# Patient Record
Sex: Female | Born: 1990 | Race: White | Hispanic: No | Marital: Single | Smoking: Never smoker
Health system: Southern US, Community
[De-identification: ages and names within clinical notes are randomized; demographics above are authoritative.]

## PROBLEM LIST (undated history)

## (undated) DIAGNOSIS — G43909 Migraine, unspecified, not intractable, without status migrainosus: Secondary | ICD-10-CM

## (undated) DIAGNOSIS — R41840 Attention and concentration deficit: Secondary | ICD-10-CM

## (undated) DIAGNOSIS — Z87440 Personal history of urinary (tract) infections: Secondary | ICD-10-CM

## (undated) DIAGNOSIS — F329 Major depressive disorder, single episode, unspecified: Secondary | ICD-10-CM

## (undated) DIAGNOSIS — F32A Depression, unspecified: Secondary | ICD-10-CM

## (undated) DIAGNOSIS — J45909 Unspecified asthma, uncomplicated: Secondary | ICD-10-CM

## (undated) HISTORY — DX: Unspecified asthma, uncomplicated: J45.909

## (undated) HISTORY — DX: Personal history of urinary (tract) infections: Z87.440

## (undated) HISTORY — DX: Attention and concentration deficit: R41.840

## (undated) HISTORY — DX: Major depressive disorder, single episode, unspecified: F32.9

## (undated) HISTORY — DX: Migraine, unspecified, not intractable, without status migrainosus: G43.909

## (undated) HISTORY — DX: Depression, unspecified: F32.A

---
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
## 2012-02-14 ENCOUNTER — Emergency Department: Payer: Self-pay | Admitting: Emergency Medicine

## 2012-02-14 LAB — URINALYSIS, COMPLETE
Glucose,UR: NEGATIVE mg/dL (ref 0–75)
Nitrite: NEGATIVE
Ph: 6 (ref 4.5–8.0)
Protein: 30
Transitional Epi: <1
WBC UR: 242 /HPF (ref 0–5)

## 2012-02-14 LAB — PREGNANCY, URINE: Pregnancy Test, Urine: NEGATIVE m[IU]/mL

## 2016-06-13 ENCOUNTER — Ambulatory Visit: Payer: Self-pay | Admitting: Primary Care

## 2016-06-27 ENCOUNTER — Ambulatory Visit (INDEPENDENT_AMBULATORY_CARE_PROVIDER_SITE_OTHER): Payer: PRIVATE HEALTH INSURANCE | Admitting: Primary Care

## 2016-06-27 ENCOUNTER — Encounter: Payer: Self-pay | Admitting: Primary Care

## 2016-06-27 VITALS — BP 104/64 | HR 75 | Temp 98.5°F | Ht 66.0 in | Wt 123.4 lb

## 2016-06-27 DIAGNOSIS — R41840 Attention and concentration deficit: Secondary | ICD-10-CM | POA: Diagnosis not present

## 2016-06-27 DIAGNOSIS — J4520 Mild intermittent asthma, uncomplicated: Secondary | ICD-10-CM | POA: Diagnosis not present

## 2016-06-27 DIAGNOSIS — J45909 Unspecified asthma, uncomplicated: Secondary | ICD-10-CM | POA: Insufficient documentation

## 2016-06-27 DIAGNOSIS — F329 Major depressive disorder, single episode, unspecified: Secondary | ICD-10-CM

## 2016-06-27 DIAGNOSIS — G43901 Migraine, unspecified, not intractable, with status migrainosus: Secondary | ICD-10-CM | POA: Diagnosis not present

## 2016-06-27 DIAGNOSIS — F32A Depression, unspecified: Secondary | ICD-10-CM

## 2016-06-27 DIAGNOSIS — G43909 Migraine, unspecified, not intractable, without status migrainosus: Secondary | ICD-10-CM | POA: Insufficient documentation

## 2016-06-27 NOTE — Assessment & Plan Note (Signed)
Dates back to middle school, never on any medications. Symptoms progressing for the past year, now grades suffering. Will set her up for formal testing through psychology. Once results received, will consider treatment.

## 2016-06-27 NOTE — Progress Notes (Signed)
Pre visit review using our clinic review tool, if applicable. No additional management support is needed unless otherwise documented below in the visit note. 

## 2016-06-27 NOTE — Progress Notes (Signed)
Subjective:    Patient ID: Kaitlyn Finley, female    DOB: 12/24/1990, 25 y.o.   MRN: [id number]  HPI  Kaitlyn Finley is a 25 year old female who presents today to establish care and discuss the problems mentioned below. Will obtain old records.  1) Difficulty Concentrating: Struggled with for most of her life. She was once told that she had ADD as a child and was never managed on medication. She's having difficulty focusing in class, has difficulty listening to her professors and comprehending the lecture, she is easily distracted. Her symptoms have progressed for the past 1 year and she's started noticing her grades have been suffering.  2) Asthma: Diagnosed in middle school. She has an albuterol inhaler for which she uses with exercise mostly. She is recently recovering from a cold and has used her inhaler more often. This is a rare occurrence. She denies wheezing, shortness of breath, cough on a frequent basis.  3) Migraines: History of in high school, mostly stress related. Her last migraine was three months ago. Migraines are typically located behind her eyes. She will take Aleve and fall asleep with improvement. She does experience frontal headaches once every 2 weeks. Overall her headaches are not interfering with her daily routine.  4) Depression: Diagnosed her Freshman year in college, but has struggled intermittently since middle school. She was seeing a therapist regularly and was managed on Wellbutrin for about a year in high school. She continues to see a counselor at her college campus occasionally. Overall her symptoms are well managed with her therapist.  Review of Systems  Constitutional: Negative for fever.  HENT: Positive for congestion.   Respiratory: Negative for shortness of breath.   Cardiovascular: Negative for chest pain.  Neurological: Positive for headaches.  Psychiatric/Behavioral: Positive for decreased concentration. Negative for sleep disturbance and suicidal  ideas. The patient is not nervous/anxious.        Past Medical History:  Diagnosis Date  . Asthma   . Depression   . Difficulty concentrating   . History of UTI   . Migraine      Social History   Social History  . Marital status: Single    Spouse name: N/A  . Number of children: N/A  . Years of education: N/A   Occupational History  . Not on file.   Social History Main Topics  . Smoking status: Never Smoker  . Smokeless tobacco: Not on file  . Alcohol use Yes  . Drug use: Unknown  . Sexual activity: Not on file   Other Topics Concern  . Not on file   Social History Narrative   Single.   Student at [employer].   Studying Public Relations.   Aspires to work for sports team.   Enjoys dancing, doing yoga, exercising.      Past Surgical History:  Procedure Laterality Date  . TONSILLECTOMY AND ADENOIDECTOMY  2000    Family History  Problem Relation Age of Onset  . Hypertension Father   . Diabetes Father   . Diabetes Paternal Grandmother   . Hyperlipidemia Paternal Grandmother   . Hypertension Paternal Grandmother     No Known Allergies  No current outpatient prescriptions on file prior to visit.   No current facility-administered medications on file prior to visit.     BP 104/64   Pulse 75   Temp 98.5 F (36.9 C) (Oral)   Ht 5\' 6"  (1.676 m)   Wt 123 lb 6.4 oz (56  kg)   SpO2 98%   BMI 19.92 kg/m    Objective:   Physical Exam  Constitutional: She appears well-nourished.  Neck: Neck supple.  Cardiovascular: Normal rate and regular rhythm.   Pulmonary/Chest: Effort normal and breath sounds normal.  Skin: Skin is warm and dry.  Psychiatric: She has a normal mood and affect.          Assessment & Plan:

## 2016-06-27 NOTE — Assessment & Plan Note (Signed)
Diagnosed in high school.  Does well off of medication, previously managed on Wellbutrin. Sees therapist occasionally at school. Will continue to monitor.

## 2016-06-27 NOTE — Assessment & Plan Note (Signed)
Mostly exercise induced and aggravated with URI's. No wheezing, SOB, cough on a daily basis. Infrequent use of albuterol inhaler.

## 2016-06-27 NOTE — Assessment & Plan Note (Signed)
Once every 2-3 months. Overall manages well with Aleve and sleep.

## 2016-06-27 NOTE — Patient Instructions (Signed)
You will be contacted regarding your referral to psychology for ADHD testing.  Please let us know if you have not heard back within one week.  Please e-mail me through My Chart once you've been tested. Once I receive your results we will need to meet again to discuss treatment options.  Cough/Congestion: Try taking Mucinex DM. This will help loosen up the mucous in your chest. Ensure you take this medication with a full glass of water.  It was a pleasure to meet you today! Please don't hesitate to call me with any questions. Welcome to [facility]!

## 2016-07-23 ENCOUNTER — Ambulatory Visit: Payer: 59 | Admitting: Psychology

## 2017-10-30 ENCOUNTER — Ambulatory Visit: Payer: Self-pay | Admitting: Certified Nurse Midwife
